# Patient Record
Sex: Female | Born: 1990 | Hispanic: Yes | Marital: Married | Smoking: Never smoker
Health system: Southern US, Community
[De-identification: ages and names within clinical notes are randomized; demographics above are authoritative.]

---

## 2018-01-17 ENCOUNTER — Ambulatory Visit (HOSPITAL_COMMUNITY)
Admission: EM | Admit: 2018-01-17 | Discharge: 2018-01-17 | Disposition: A | Discharge status: Home / Self Care | Payer: Self-pay | Attending: Family Medicine | Admitting: Family Medicine

## 2018-01-17 ENCOUNTER — Encounter (HOSPITAL_COMMUNITY): Payer: Self-pay | Admitting: Emergency Medicine

## 2018-01-17 ENCOUNTER — Other Ambulatory Visit: Payer: Self-pay

## 2018-01-17 DIAGNOSIS — R197 Diarrhea, unspecified: Secondary | ICD-10-CM | POA: Insufficient documentation

## 2018-01-17 DIAGNOSIS — R109 Unspecified abdominal pain: Secondary | ICD-10-CM | POA: Insufficient documentation

## 2018-01-17 DIAGNOSIS — R1084 Generalized abdominal pain: Secondary | ICD-10-CM

## 2018-01-17 LAB — CBC
HCT: 38.8 % (ref 36.0–46.0)
Hemoglobin: 13.2 g/dL (ref 12.0–15.0)
MCH: 30.1 pg (ref 26.0–34.0)
MCHC: 34 g/dL (ref 30.0–36.0)
MCV: 88.4 fL (ref 78.0–100.0)
PLATELETS: 300 10*3/uL (ref 150–400)
RBC: 4.39 MIL/uL (ref 3.87–5.11)
RDW: 13.4 % (ref 11.5–15.5)
WBC: 6.1 10*3/uL (ref 4.0–10.5)

## 2018-01-17 LAB — COMPREHENSIVE METABOLIC PANEL
ALK PHOS: 58 U/L (ref 38–126)
ALT: 93 U/L — ABNORMAL HIGH (ref 14–54)
AST: 70 U/L — AB (ref 15–41)
Albumin: 3.5 g/dL (ref 3.5–5.0)
Anion gap: 10 (ref 5–15)
BUN: 8 mg/dL (ref 6–20)
CALCIUM: 8.6 mg/dL — AB (ref 8.9–10.3)
CO2: 25 mmol/L (ref 22–32)
CREATININE: 0.57 mg/dL (ref 0.44–1.00)
Chloride: 99 mmol/L — ABNORMAL LOW (ref 101–111)
GFR calc non Af Amer: >60 mL/min (ref >60)
GLUCOSE: 100 mg/dL — AB (ref 65–99)
Potassium: 3.5 mmol/L (ref 3.5–5.1)
SODIUM: 134 mmol/L — AB (ref 135–145)
Total Bilirubin: 0.4 mg/dL (ref 0.3–1.2)
Total Protein: 7.4 g/dL (ref 6.5–8.1)

## 2018-01-17 LAB — POCT URINALYSIS DIP (DEVICE)
GLUCOSE, UA: NEGATIVE mg/dL
Hgb urine dipstick: NEGATIVE
Leukocytes, UA: NEGATIVE
Nitrite: NEGATIVE
PH: 7 (ref 5.0–8.0)
PROTEIN: 30 mg/dL — AB
SPECIFIC GRAVITY, URINE: 1.025 (ref 1.005–1.030)
Urobilinogen, UA: >=8 mg/dL (ref 0.0–1.0)

## 2018-01-17 LAB — LIPASE, BLOOD: LIPASE: 28 U/L (ref 11–51)

## 2018-01-17 LAB — POCT PREGNANCY, URINE: Preg Test, Ur: NEGATIVE

## 2018-01-17 MED ORDER — ONDANSETRON 4 MG PO TBDP: 4.0000 mg | ORAL_TABLET | Freq: Three times a day (TID) | ORAL | 0 refills | Status: AC | PRN

## 2018-01-17 NOTE — ED Notes (Signed)
Patient only provided a dirty urine.

## 2018-01-17 NOTE — ED Triage Notes (Signed)
Upper abdominal pain that started yesterday "like growls".  Patient has nausea, no vomiting, chills and feels feverish.

## 2018-01-17 NOTE — ED Provider Notes (Signed)
[facility]    CSN: [id number] Arrival date & time: 01/17/18  1729     History   Chief Complaint Chief Complaint  Patient presents with  . Abdominal Pain    HPI Candice Smith is a 27 y.o. female previous cholecystectomy presenting today with abdominal pain.  States that beginning yesterday she started to have a sharp stabbing pain in her upper abdomen and lower abdomen.  States that it is present at rest, but can worsen at times.  She is also had diarrhea since Sunday night, but this has resolved yesterday.  Patient had a normal bowel movement today.  She has had decreased appetite, attempted to eat something today, and began to feel nauseous and caused pain to worsen.  Denies any chest pain or shortness of breath.  Denies cough or congestion.  Denies urinary symptoms of dysuria, increased frequency.  Denies any vaginal discharge or pelvic pain.  Patient has IUD in place, does not have menstrual cycle with this.  HPI  History reviewed. No pertinent past medical history.  There are no active problems to display for this patient.   History reviewed. No pertinent surgical history.  OB History    No data available       Home Medications    Prior to Admission medications   Medication Sig Start Date End Date Taking? Authorizing Provider  ondansetron (ZOFRAN ODT) 4 MG disintegrating tablet Take 1 tablet (4 mg total) by mouth every 8 (eight) hours as needed for nausea or vomiting. 01/17/18   Lavonia Eager, Junius Creamer, PA-C    Family History History reviewed. No pertinent family history.  Social History Social History   Tobacco Use  . Smoking status: Never Smoker  Substance Use Topics  . Alcohol use: Yes  . Drug use: No     Allergies   Patient has no known allergies.   Review of Systems Review of Systems  Constitutional: Negative for chills, fatigue and fever.  HENT: Negative for congestion, ear pain, rhinorrhea, sore throat and trouble swallowing.     Respiratory: Negative for cough, chest tightness and shortness of breath.   Cardiovascular: Negative for chest pain.  Gastrointestinal: Positive for abdominal pain, diarrhea and nausea. Negative for blood in stool and vomiting.  Genitourinary: Negative for dysuria, flank pain, genital sores, hematuria, menstrual problem, vaginal bleeding, vaginal discharge and vaginal pain.  Musculoskeletal: Negative for back pain and myalgias.  Skin: Negative for rash.  Neurological: Negative for dizziness, light-headedness and headaches.     Physical Exam Triage Vital Signs ED Triage Vitals  Enc Vitals Group     BP 01/17/18 1848 109/65     Pulse Rate 01/17/18 1848 86     Resp 01/17/18 1848 20     Temp 01/17/18 1848 98.3 F (36.8 C)     Temp Source 01/17/18 1848 Oral     SpO2 01/17/18 1848 99 %     Weight --      Height --      Head Circumference --      Peak Flow --      Pain Score 01/17/18 1845 6     Pain Loc --      Pain Edu? --      Excl. in GC? --    No data found.  Updated Vital Signs BP 109/65 (BP Location: Left Arm)   Pulse 86   Temp 98.3 F (36.8 C) (Oral)   Resp 20   SpO2 99%   Visual Acuity Right  Eye Distance:   Left Eye Distance:   Bilateral Distance:    Right Eye Near:   Left Eye Near:    Bilateral Near:     Physical Exam  Constitutional: She appears well-developed and well-nourished. No distress.  HENT:  Head: Normocephalic and atraumatic.  Eyes: Conjunctivae are normal.  Neck: Neck supple.  Cardiovascular: Normal rate and regular rhythm.  No murmur heard. Pulmonary/Chest: Effort normal and breath sounds normal. No respiratory distress.  Abdominal: Soft. There is tenderness.  Scars present from previous cholecystectomy.  Abdomen is soft, nondistended, tenderness to palpation diffusely across lower abdomen, epigastrium, tenderness worse in epigastrium.  Negative Murphy's, negative Rovsing's, negative McBurney's, negative rebound  Musculoskeletal: She  exhibits no edema.  Neurological: She is alert.  Skin: Skin is warm and dry.  Psychiatric: She has a normal mood and affect.  Nursing note and vitals reviewed.    UC Treatments / Results  Labs (all labs ordered are listed, but only abnormal results are displayed) Labs Reviewed  POCT URINALYSIS DIP (DEVICE) - Abnormal; Notable for the following components:      Result Value   Bilirubin Urine SMALL (*)    Ketones, ur TRACE (*)    Protein, ur 30 (*)    All other components within normal limits  COMPREHENSIVE METABOLIC PANEL  CBC  LIPASE, BLOOD  POCT PREGNANCY, URINE    EKG  EKG Interpretation None       Radiology No results found.  Procedures Procedures (including critical care time)  Medications Ordered in UC Medications - No data to display   Initial Impression / Assessment and Plan / UC Course  I have reviewed the triage vital signs and the nursing notes.  Pertinent labs & imaging results that were available during my care of the patient were reviewed by me and considered in my medical decision making (see chart for details).     Patient with abdominal pain associated with diarrhea.  Diarrhea has resolved.  Possible gastroenteritis versus gastritis.  Given sharp nature and tenderness on exam, will check lipase, CMP and CBC.  Provided Zofran to use as needed for nausea.  Advised if she is any worsening of her abdominal pain or no improvement with the Zofran to go to emergency room.  Discussed strict return precautions. Patient verbalized understanding and is agreeable with plan.   Final Clinical Impressions(s) / UC Diagnoses   Final diagnoses:  Generalized abdominal pain  Diarrhea, unspecified type    ED Discharge Orders        Ordered    ondansetron (ZOFRAN ODT) 4 MG disintegrating tablet  Every 8 hours PRN     01/17/18 1907       Controlled Substance Prescriptions [place] Controlled Substance Registry consulted? Not Applicable   Lew DawesWieters, Laretha Luepke C,  [place]-C 01/17/18 1925

## 2018-01-17 NOTE — Discharge Instructions (Signed)

## 2018-01-22 ENCOUNTER — Telehealth (HOSPITAL_COMMUNITY): Payer: Self-pay | Admitting: *Deleted

## 2018-01-22 NOTE — Telephone Encounter (Signed)
Pt called regarding results from recent visit, no answer at this time. Message left requesting the patient to call back at their convenience.

## 2022-04-25 ENCOUNTER — Emergency Department (HOSPITAL_BASED_OUTPATIENT_CLINIC_OR_DEPARTMENT_OTHER): Payer: Self-pay

## 2022-04-25 ENCOUNTER — Encounter (HOSPITAL_BASED_OUTPATIENT_CLINIC_OR_DEPARTMENT_OTHER): Payer: Self-pay | Admitting: Urology

## 2022-04-25 ENCOUNTER — Other Ambulatory Visit: Payer: Self-pay

## 2022-04-25 ENCOUNTER — Emergency Department (HOSPITAL_BASED_OUTPATIENT_CLINIC_OR_DEPARTMENT_OTHER)
Admission: EM | Admit: 2022-04-25 | Discharge: 2022-04-25 | Disposition: A | Discharge status: Home / Self Care | Payer: Self-pay | Attending: Emergency Medicine | Admitting: Emergency Medicine

## 2022-04-25 DIAGNOSIS — R197 Diarrhea, unspecified: Secondary | ICD-10-CM | POA: Insufficient documentation

## 2022-04-25 DIAGNOSIS — R110 Nausea: Secondary | ICD-10-CM | POA: Insufficient documentation

## 2022-04-25 DIAGNOSIS — R945 Abnormal results of liver function studies: Secondary | ICD-10-CM | POA: Insufficient documentation

## 2022-04-25 DIAGNOSIS — R1011 Right upper quadrant pain: Secondary | ICD-10-CM | POA: Insufficient documentation

## 2022-04-25 DIAGNOSIS — K760 Fatty (change of) liver, not elsewhere classified: Secondary | ICD-10-CM | POA: Insufficient documentation

## 2022-04-25 DIAGNOSIS — R1013 Epigastric pain: Secondary | ICD-10-CM | POA: Insufficient documentation

## 2022-04-25 LAB — CBC
HCT: 40 % (ref 36.0–46.0)
Hemoglobin: 13.8 g/dL (ref 12.0–15.0)
MCH: 30.4 pg (ref 26.0–34.0)
MCHC: 34.5 g/dL (ref 30.0–36.0)
MCV: 88.1 fL (ref 80.0–100.0)
Platelets: 365 10*3/uL (ref 150–400)
RBC: 4.54 MIL/uL (ref 3.87–5.11)
RDW: 13.5 % (ref 11.5–15.5)
WBC: 8.3 10*3/uL (ref 4.0–10.5)
nRBC: 0 % (ref 0.0–0.2)

## 2022-04-25 LAB — COMPREHENSIVE METABOLIC PANEL
ALT: 149 U/L — ABNORMAL HIGH (ref 0–44)
AST: 72 U/L — ABNORMAL HIGH (ref 15–41)
Albumin: 3.7 g/dL (ref 3.5–5.0)
Alkaline Phosphatase: 66 U/L (ref 38–126)
Anion gap: 6 (ref 5–15)
BUN: 11 mg/dL (ref 6–20)
CO2: 25 mmol/L (ref 22–32)
Calcium: 9 mg/dL (ref 8.9–10.3)
Chloride: 104 mmol/L (ref 98–111)
Creatinine, Ser: 0.6 mg/dL (ref 0.44–1.00)
GFR, Estimated: >60 mL/min (ref >60)
Glucose, Bld: 108 mg/dL — ABNORMAL HIGH (ref 70–99)
Potassium: 3.5 mmol/L (ref 3.5–5.1)
Sodium: 135 mmol/L (ref 135–145)
Total Bilirubin: 0.3 mg/dL (ref 0.3–1.2)
Total Protein: 7.9 g/dL (ref 6.5–8.1)

## 2022-04-25 LAB — LIPASE, BLOOD: Lipase: 36 U/L (ref 11–51)

## 2022-04-25 LAB — URINALYSIS, ROUTINE W REFLEX MICROSCOPIC
Bilirubin Urine: NEGATIVE
Glucose, UA: NEGATIVE mg/dL
Ketones, ur: NEGATIVE mg/dL
Nitrite: NEGATIVE
Protein, ur: NEGATIVE mg/dL
Specific Gravity, Urine: 1.025 (ref 1.005–1.030)
pH: 7 (ref 5.0–8.0)

## 2022-04-25 LAB — URINALYSIS, MICROSCOPIC (REFLEX)

## 2022-04-25 LAB — PREGNANCY, URINE: Preg Test, Ur: NEGATIVE

## 2022-04-25 MED ORDER — LIDOCAINE VISCOUS HCL 2 % MT SOLN
15.0000 mL | Freq: Once | OROMUCOSAL | Status: AC
  Administered 2022-04-25: 15 mL via ORAL
  Filled 2022-04-25: qty 15

## 2022-04-25 MED ORDER — ALUM & MAG HYDROXIDE-SIMETH 200-200-20 MG/5ML PO SUSP
30.0000 mL | Freq: Once | ORAL | Status: AC
  Administered 2022-04-25: 30 mL via ORAL
  Filled 2022-04-25: qty 30

## 2022-04-25 MED ORDER — FAMOTIDINE 20 MG PO TABS: 20.0000 mg | ORAL_TABLET | Freq: Two times a day (BID) | ORAL | 0 refills | Status: AC

## 2022-04-25 MED ORDER — ONDANSETRON HCL 4 MG PO TABS: 4.0000 mg | ORAL_TABLET | Freq: Three times a day (TID) | ORAL | 0 refills | Status: AC | PRN

## 2022-04-25 NOTE — ED Notes (Signed)
D/c paperwork reviewed with pt, including prescriptions. Pt provided work note, per her request. No further questions or concerns at time of d/c. Pt ambulatory to ED exit without assistance, NAD noted.

## 2022-04-25 NOTE — ED Triage Notes (Signed)
RUQ pain intermittent pain x 1 week, states feels acid reflux at times Report nausea, no vomiting  H/o gallstones with removal

## 2022-04-25 NOTE — Discharge Instructions (Addendum)
You were seen today for abdominal pain.  Your visit was reassuring for no signs of pancreatitis or appendicitis.  Your gallbladder was apparently removed previously.  I have prescribed Pepcid for possible gastritis along with nausea medication.  I recommend that you follow-up with GI.  Contact information provided.

## 2022-04-25 NOTE — ED Provider Notes (Signed)
[facility] EMERGENCY DEPARTMENT Provider Note   CSN: [id number] Arrival date & time: 04/25/22  1644     History  Chief Complaint  Patient presents with   Abdominal Pain    Candice Smith is a 31 y.o. female.  Patient presents to the emergency department complaining of epigastric and right upper quadrant pain which has been ongoing for over a week has worsened over the past day.  Patient denies nausea, vomiting.  States that her pain does seem to be worse with food.  Endorses taking "detox" pills prior to the onset of her discomfort.  Patient states that she feels that she may have had a gallbladder surgery at some point in the past but is unsure.  Denies chest pain, shortness of breath, urinary symptoms.  Does endorse episode of diarrhea after taking the detox medication.  No known medical history other than possible cholecystitis after giving birth approximately 10 years ago.  HPI     Home Medications Prior to Admission medications   Medication Sig Start Date End Date Taking? Authorizing Provider  famotidine (PEPCID) 20 MG tablet Take 1 tablet (20 mg total) by mouth 2 (two) times daily. 04/25/22  Yes Barrie Dunker B, PA-C  ondansetron (ZOFRAN) 4 MG tablet Take 1 tablet (4 mg total) by mouth every 8 (eight) hours as needed for nausea or vomiting. 04/25/22  Yes Barrie Dunker B, PA-C  ondansetron (ZOFRAN ODT) 4 MG disintegrating tablet Take 1 tablet (4 mg total) by mouth every 8 (eight) hours as needed for nausea or vomiting. 01/17/18   Wieters, Hallie C, PA-C      Allergies    Patient has no known allergies.    Review of Systems   Review of Systems  Constitutional:  Negative for fever.  Respiratory:  Negative for shortness of breath.   Cardiovascular:  Negative for chest pain.  Gastrointestinal:  Positive for abdominal pain and diarrhea. Negative for nausea and vomiting.  Genitourinary:  Negative for dysuria and vaginal bleeding.    Physical Exam Updated Vital  Signs BP 107/64   Pulse 74   Temp 98 F (36.7 C) (Oral)   Resp 17   Ht 5\' 2"  (1.575 m)   Wt 104.3 kg   SpO2 100%   BMI 42.07 kg/m  Physical Exam Vitals and nursing note reviewed.  Constitutional:      General: She is not in acute distress.    Appearance: She is obese.  HENT:     Head: Normocephalic and atraumatic.  Eyes:     Extraocular Movements: Extraocular movements intact.  Cardiovascular:     Rate and Rhythm: Normal rate and regular rhythm.     Heart sounds: Normal heart sounds.  Pulmonary:     Effort: Pulmonary effort is normal.     Breath sounds: Normal breath sounds.  Abdominal:     General: Abdomen is flat.     Palpations: Abdomen is soft.     Tenderness: There is abdominal tenderness in the right upper quadrant and epigastric area. There is no right CVA tenderness or left CVA tenderness.  Skin:    General: Skin is warm and dry.  Neurological:     Mental Status: She is alert.     ED Results / Procedures / Treatments   Labs (all labs ordered are listed, but only abnormal results are displayed) Labs Reviewed  COMPREHENSIVE METABOLIC PANEL - Abnormal; Notable for the following components:      Result Value   Glucose, Bld 108 (*)  AST 72 (*)    ALT 149 (*)    All other components within normal limits  URINALYSIS, ROUTINE W REFLEX MICROSCOPIC - Abnormal; Notable for the following components:   Hgb urine dipstick TRACE (*)    Leukocytes,Ua TRACE (*)    All other components within normal limits  URINALYSIS, MICROSCOPIC (REFLEX) - Abnormal; Notable for the following components:   Bacteria, UA FEW (*)    All other components within normal limits  LIPASE, BLOOD  CBC  PREGNANCY, URINE    EKG None  Radiology US Abdomen Limited RUQ (LIVER/GB)  Result Date: 04/25/2022 CLINICAL DATA:  Right upper quadrant pain EXAM: ULTRASOUND ABDOMEN LIMITED RIGHT UPPER QUADRANT COMPARISON:  None Available. FINDINGS: Gallbladder: Gallbladder is not visualized. This could  indicate contraction or prior cholecystectomy. Correlate with clinical history. Common bile duct: Diameter: 4 mm, normal Liver: Diffusely increased parenchymal echotexture suggesting fatty infiltration. No focal lesions are identified. Portal vein is patent on color Doppler imaging with normal direction of blood flow towards the liver. Other: None. IMPRESSION: Diffuse fatty infiltration of the liver. Nonvisualization of the gallbladder may indicate contraction or cholecystectomy. Electronically Signed   By: Burman Nieves M.D.   On: 04/25/2022 19:29    Procedures Procedures    Medications Ordered in ED Medications  alum & mag hydroxide-simeth (MAALOX/MYLANTA) 200-200-20 MG/5ML suspension 30 mL (30 mLs Oral Given 04/25/22 1814)    And  lidocaine (XYLOCAINE) 2 % viscous mouth solution 15 mL (15 mLs Oral Given 04/25/22 1815)    ED Course/ Medical Decision Making/ A&P                           Medical Decision Making Amount and/or Complexity of Data Reviewed Labs: ordered. Radiology: ordered.  Risk OTC drugs. Prescription drug management.   This patient presents to the ED for concern of abdominal pain, this involves an extensive number of treatment options, and is a complaint that carries with it a high risk of complications and morbidity.  The differential diagnosis includes cholecystitis, pancreatitis, appendicitis, gastritis, gastroenteritis, reflux, and others   Co morbidities that complicate the patient evaluation  Possible history of biliary colic versus cholecystectomy   Additional history obtained:   External records from outside source obtained and reviewed including urgent care notes from 2019 documenting abdominal pain episode which does document apparent cholecystectomy   Lab Tests:  I Ordered, and personally interpreted labs.  The pertinent results include: Lipase 36, normal CBC, glucose 108, AST 72, ALT 149, urinalysis with trace hemoglobin, trace leukocytes, few  bacteria, negative urine pregnancy   Imaging Studies ordered:  I ordered imaging studies including ultrasound of the right upper quadrant I independently visualized and interpreted imaging which showed diffuse fatty infiltration of the liver.  Nonvisualization of the gallbladder which may indicate contraction or cholecystectomy. I agree with the radiologist interpretation   Problem List / ED Course / Critical interventions / Medication management   I ordered medication including GI cocktail for possible reflux symptoms Reevaluation of the patient after these medicines showed that the patient stayed the same I have reviewed the patients home medicines and have made adjustments as needed   Social Determinants of Health:  No primary care provider   Test / Admission - Considered:  The patient apparently had a cholecystectomy approximately 10 years ago based on imaging and previous notes that were found.  Patient does have slightly elevated liver enzymes as well.  This seems most consistent  at this time with gastritis.  Patient states she drinks rarely.  She does have fatty liver on imaging as well.  Lipase of 36 shows makes suspicion very low for pancreatitis.  Location of pain and longevity of pain makes appendicitis highly unlikely.  Once again seems consistent with gastritis at this time.  Plan to discharge home with information on contacting GI for outpatient follow-up along with prescription for Pepcid.  Return precautions provided        Final Clinical Impression(s) / ED Diagnoses Final diagnoses:  Right upper quadrant abdominal pain  Nausea    Rx / DC Orders ED Discharge Orders          Ordered    famotidine (PEPCID) 20 MG tablet  2 times daily        04/25/22 2036    ondansetron (ZOFRAN) 4 MG tablet  Every 8 hours PRN        04/25/22 2036              Pamala Duffel 04/25/22 2042    Terald Sleeper, MD 04/26/22 1022

## 2022-04-25 NOTE — ED Notes (Signed)
Pt unable to urinate at this time, urine spec cup given while waiting for room 

## 2023-07-18 IMAGING — US US ABDOMEN LIMITED
1 series · 14 of 25 positions shown · non-contrast
Comparison: None Available.

CLINICAL DATA: Right upper quadrant pain

EXAM:
ULTRASOUND ABDOMEN LIMITED RIGHT UPPER QUADRANT

[Series 1: us abdomen limited · 14 of 38 slices shown]
[im 1/38]
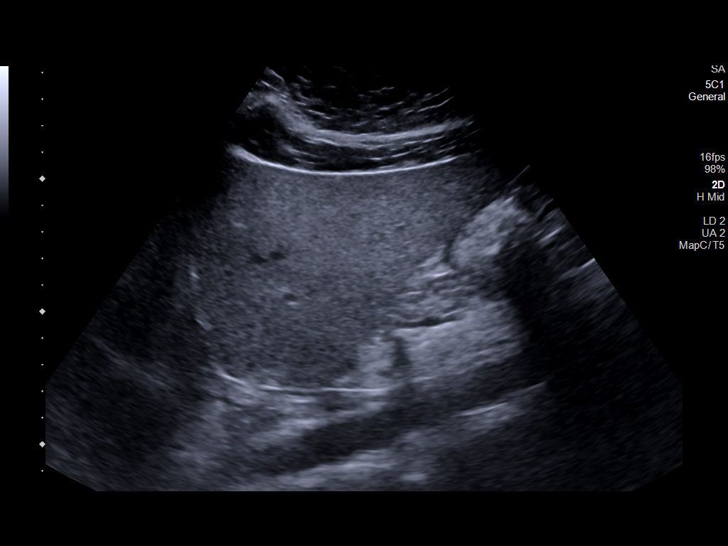
[im 4/38]
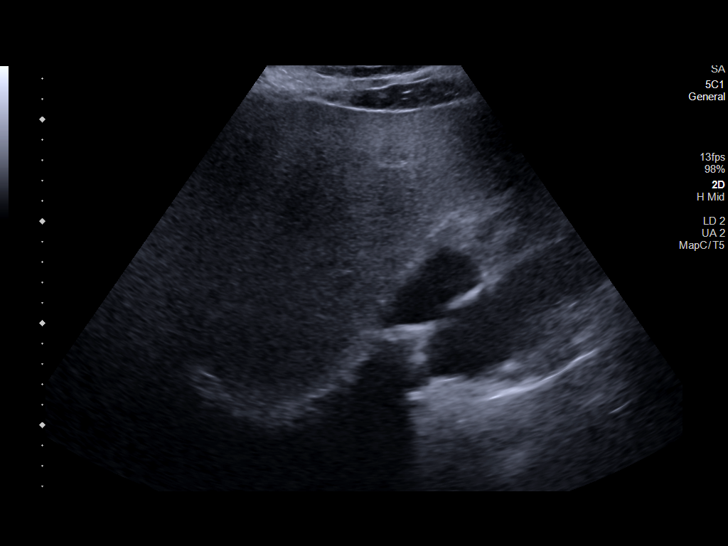
[im 7/38]
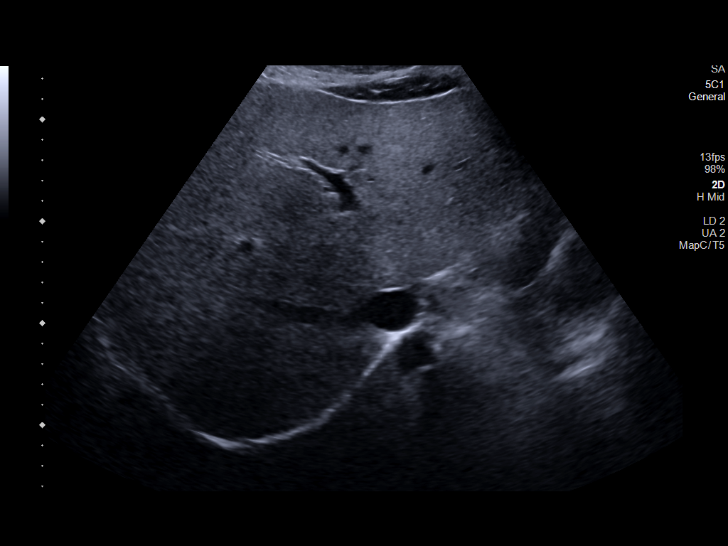
[im 10/38]
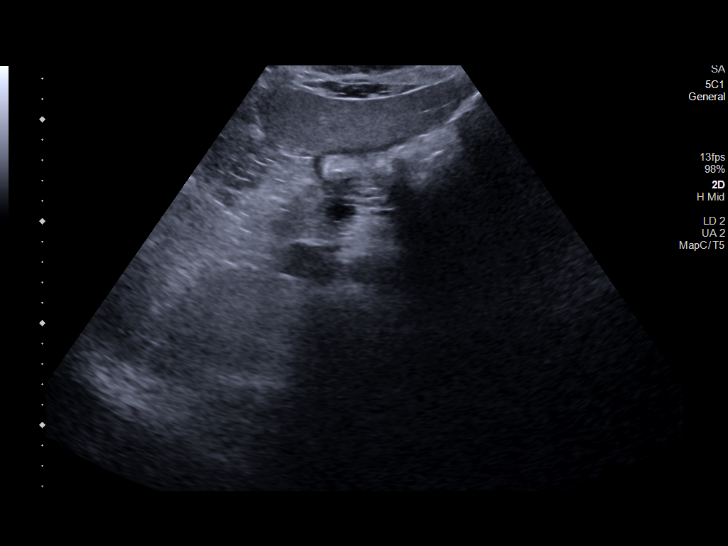
[im 13/38]
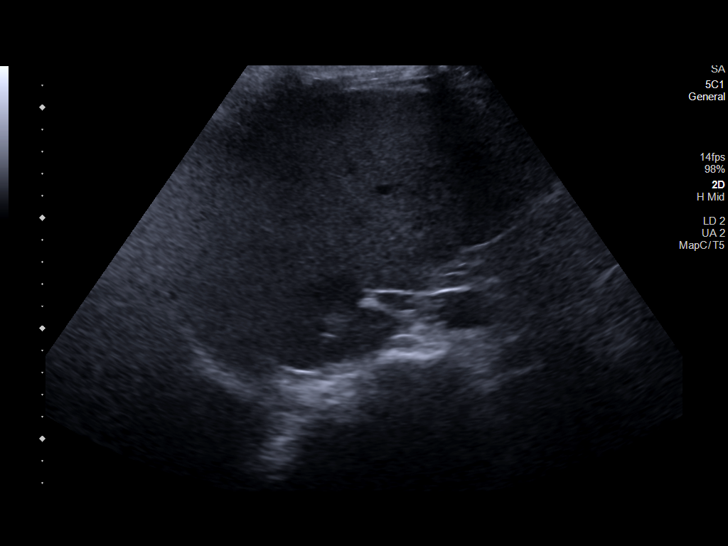
[im 14/38]
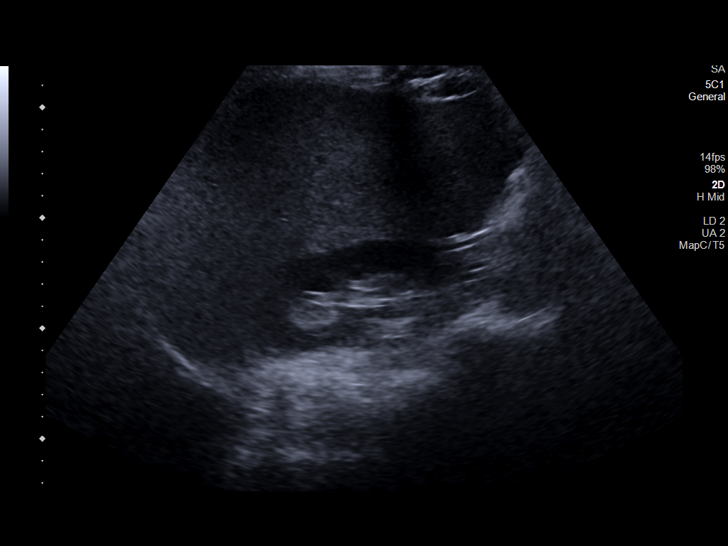
[im 17/38]
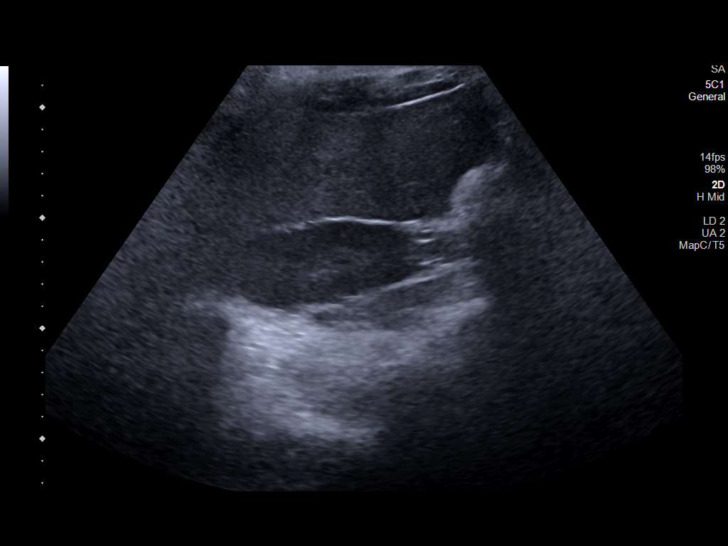
[im 21/38]
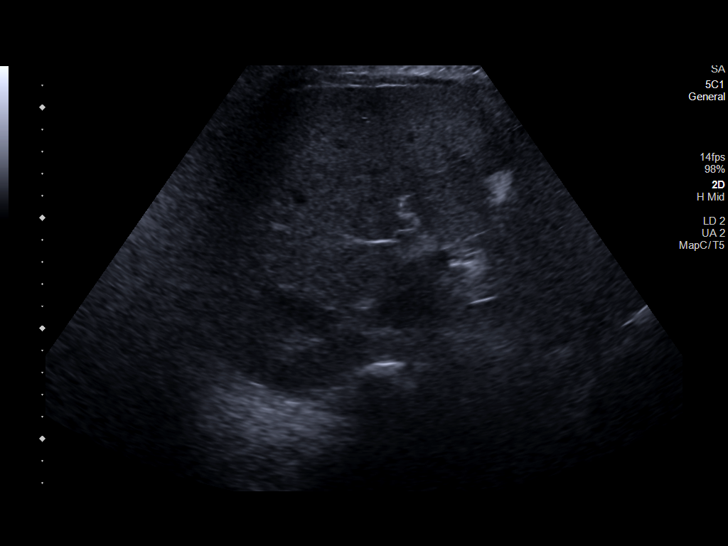
[im 24/38]
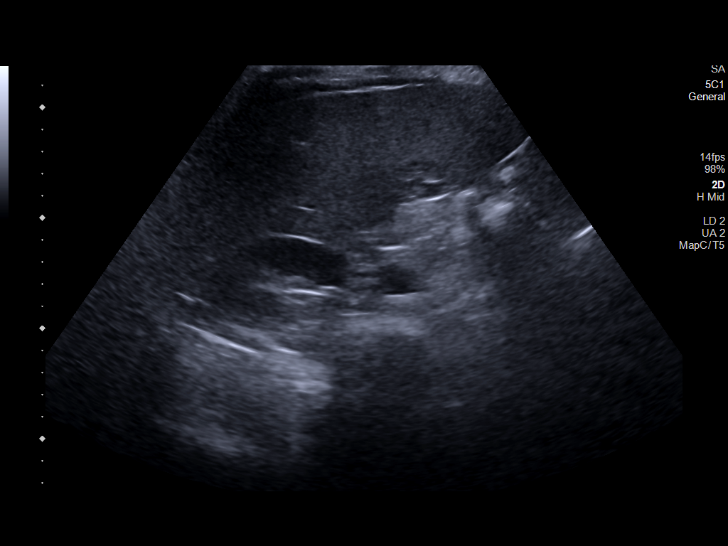
[im 25/38]
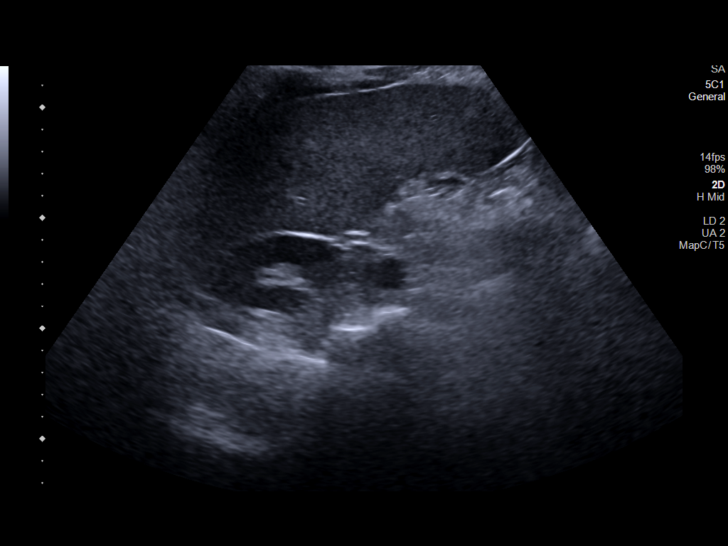
[im 28/38]
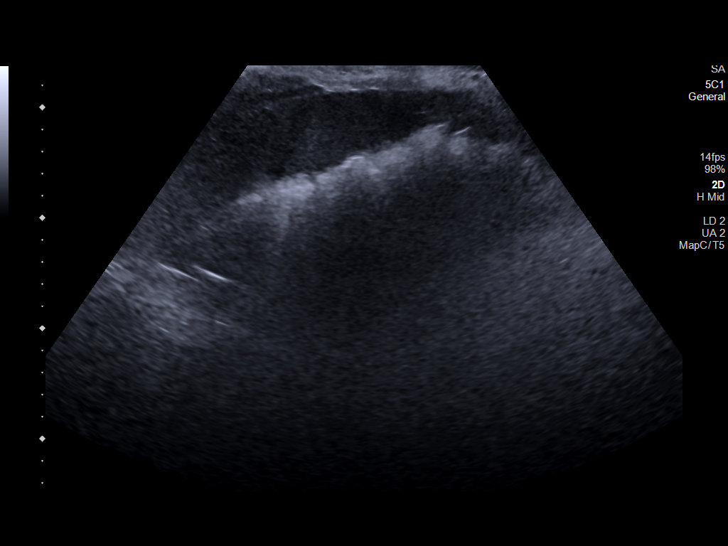
[im 31/38]
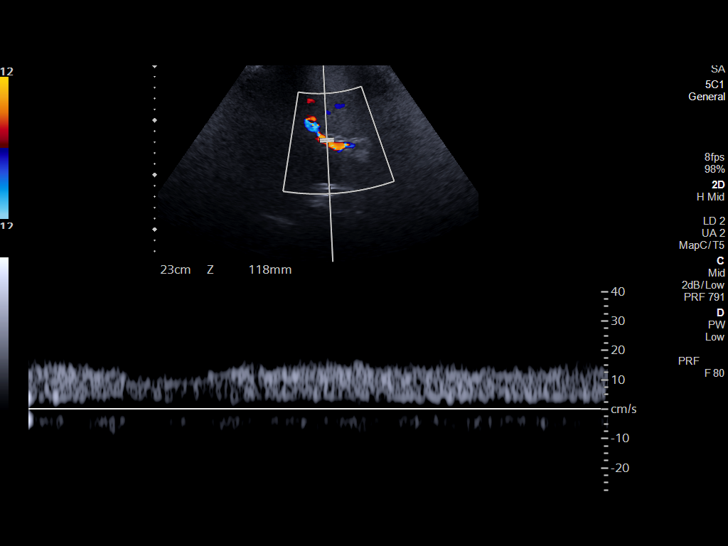
[im 34/38]
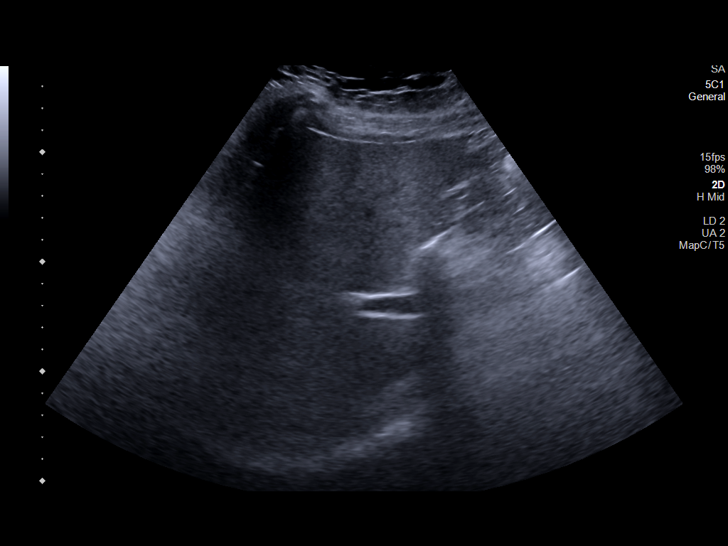
[im 38/38]
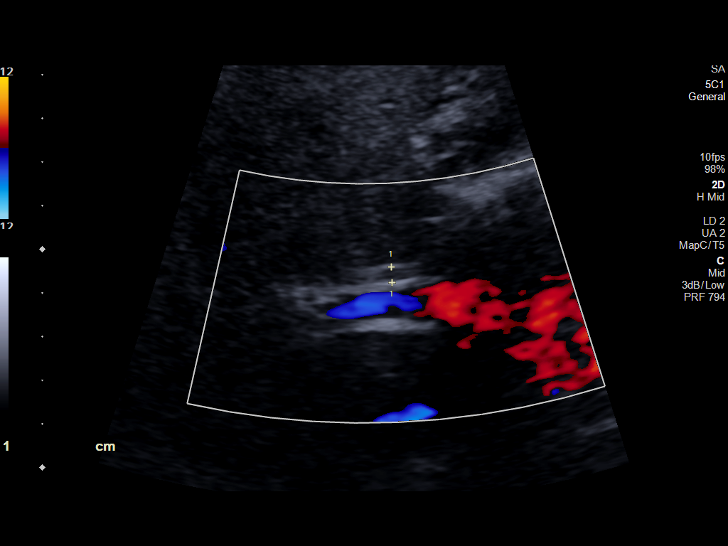

[14 of 25 positions shown; findings below may reference images not displayed]

FINDINGS: Gallbladder:

Gallbladder is not visualized. This could indicate contraction or
prior cholecystectomy. Correlate with clinical history.

Common bile duct:

Diameter: 4 mm, normal

Liver:

Diffusely increased parenchymal echotexture suggesting fatty
infiltration. No focal lesions are identified. Portal vein is patent
on color Doppler imaging with normal direction of blood flow towards
the liver.

Other: None.
IMPRESSION: Diffuse fatty infiltration of the liver. Nonvisualization of the
gallbladder may indicate contraction or cholecystectomy.
